# Patient Record
Sex: Female | Born: 1995
Health system: Southern US, Community
[De-identification: ages and names within clinical notes are randomized; demographics above are authoritative.]

## PROBLEM LIST (undated history)

## (undated) DIAGNOSIS — K219 Gastro-esophageal reflux disease without esophagitis: Secondary | ICD-10-CM

## (undated) DIAGNOSIS — J45909 Unspecified asthma, uncomplicated: Secondary | ICD-10-CM

---
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
## 2016-01-08 ENCOUNTER — Emergency Department (HOSPITAL_COMMUNITY)
Admission: EM | Admit: 2016-01-08 | Discharge: 2016-01-09 | Disposition: A | Discharge status: Home / Self Care | Payer: Self-pay | Attending: Emergency Medicine | Admitting: Emergency Medicine

## 2016-01-08 ENCOUNTER — Encounter (HOSPITAL_COMMUNITY): Payer: Self-pay | Admitting: *Deleted

## 2016-01-08 DIAGNOSIS — J45909 Unspecified asthma, uncomplicated: Secondary | ICD-10-CM | POA: Insufficient documentation

## 2016-01-08 DIAGNOSIS — J0100 Acute maxillary sinusitis, unspecified: Secondary | ICD-10-CM | POA: Insufficient documentation

## 2016-01-08 HISTORY — DX: Unspecified asthma, uncomplicated: J45.909

## 2016-01-08 MED ORDER — AMOXICILLIN-POT CLAVULANATE 875-125 MG PO TABS
1.0000 | ORAL_TABLET | Freq: Once | ORAL | Status: AC
  Administered 2016-01-09: 1 via ORAL
  Filled 2016-01-08: qty 1

## 2016-01-08 MED ORDER — IBUPROFEN 800 MG PO TABS: 800.0000 mg | ORAL_TABLET | Freq: Three times a day (TID) | ORAL | 0 refills | Status: AC

## 2016-01-08 MED ORDER — AMOXICILLIN-POT CLAVULANATE 875-125 MG PO TABS: 1.0000 | ORAL_TABLET | Freq: Once | ORAL | Status: DC

## 2016-01-08 MED ORDER — IBUPROFEN 800 MG PO TABS
800.0000 mg | ORAL_TABLET | Freq: Once | ORAL | Status: AC
  Administered 2016-01-09: 800 mg via ORAL
  Filled 2016-01-08: qty 1

## 2016-01-08 MED ORDER — AMOXICILLIN-POT CLAVULANATE 875-125 MG PO TABS: 1.0000 | ORAL_TABLET | Freq: Two times a day (BID) | ORAL | 0 refills | Status: DC

## 2016-01-08 NOTE — ED Triage Notes (Signed)
Pt reports sore throat, nasal congestion and body aches x 3 days.

## 2016-01-08 NOTE — ED Provider Notes (Signed)
[facility]-EMERGENCY DEPT Provider Note   CSN: [id number] Arrival date & time: 01/08/16  2220     History   Chief Complaint Chief Complaint  Patient presents with  . Nasal Congestion    HPI Monica Cowan is a 20 y.o. female with a history of exercise induced asthma and intermittent allergy symptoms including nasal congestion and rhinorrhea which usually responds to an otc generic antihistamine and decongestant medicine.  For the past 3 days she has had a increased nasal congestion along with bilateral cheek and upper dentition pain, purulent nasal congestion fevers and headache with generalized body aches.  She also endorses mild sore throat associated with postnasal drip.  She has found no alleviators for her symptoms.   The history is provided by the patient.    Past Medical History:  Diagnosis Date  . Asthma    exercise induced    There are no active problems to display for this patient.   History reviewed. No pertinent surgical history.  OB History    No data available       Home Medications    Prior to Admission medications   Medication Sig Start Date End Date Taking? Authorizing Provider  amoxicillin-clavulanate (AUGMENTIN) 875-125 MG tablet Take 1 tablet by mouth every 12 (twelve) hours. 01/08/16   Burgess AmorJulie Maicey Barrientez, PA-C  ibuprofen (ADVIL,MOTRIN) 800 MG tablet Take 1 tablet (800 mg total) by mouth 3 (three) times daily. 01/08/16   Burgess AmorJulie Cephus Tupy, PA-C    Family History History reviewed. No pertinent family history.  Social History Social History  Substance Use Topics  . Smoking status: Never Smoker  . Smokeless tobacco: Never Used  . Alcohol use No     Allergies   Patient has no known allergies.   Review of Systems Review of Systems  Constitutional: Positive for fever. Negative for chills.  HENT: Positive for congestion, rhinorrhea and sore throat. Negative for ear pain, facial swelling, sinus pressure, trouble swallowing and voice change.   Eyes: Negative  for discharge.  Respiratory: Negative for cough, shortness of breath, wheezing and stridor.   Cardiovascular: Negative for chest pain.  Gastrointestinal: Negative for abdominal pain.  Genitourinary: Negative.   Musculoskeletal: Positive for myalgias.     Physical Exam Updated Vital Signs BP 129/87 (BP Location: Left Arm)   Pulse 98   Temp 100.2 F (37.9 C) (Temporal)   Resp 16   Ht 4\' 11"  (1.499 m)   Wt 66.2 kg   LMP 12/02/2015   SpO2 99%   BMI 29.49 kg/m   Physical Exam  Constitutional: She is oriented to person, place, and time. She appears well-developed and well-nourished.  HENT:  Head: Normocephalic and atraumatic.  Right Ear: Tympanic membrane and ear canal normal.  Left Ear: Tympanic membrane and ear canal normal.  Nose: Mucosal edema and rhinorrhea present. Right sinus exhibits maxillary sinus tenderness. Left sinus exhibits maxillary sinus tenderness.  Mouth/Throat: Uvula is midline, oropharynx is clear and moist and mucous membranes are normal. No oropharyngeal exudate, posterior oropharyngeal edema, posterior oropharyngeal erythema or tonsillar abscesses.  Eyes: Conjunctivae are normal.  Neck: Full passive range of motion without pain. Neck supple.  Cardiovascular: Normal rate and normal heart sounds.   Pulmonary/Chest: Effort normal. No respiratory distress. She has no wheezes. She has no rales.  Musculoskeletal: Normal range of motion.  Lymphadenopathy:    She has no cervical adenopathy.  No head or neck adenopathy.    Neurological: She is alert and oriented to person, place, and time.  Skin: Skin is warm and dry. No rash noted.  Psychiatric: She has a normal mood and affect.     ED Treatments / Results  Labs (all labs ordered are listed, but only abnormal results are displayed) Labs Reviewed  POC URINE PREG, ED   Urine preg negative.  EKG  EKG Interpretation None       Radiology No results found.  Procedures Procedures (including critical  care time)  Medications Ordered in ED Medications  amoxicillin-clavulanate (AUGMENTIN) 875-125 MG per tablet 1 tablet (not administered)  ibuprofen (ADVIL,MOTRIN) tablet 800 mg (not administered)     Initial Impression / Assessment and Plan / ED Course  I have reviewed the triage vital signs and the nursing notes.  Pertinent labs & imaging results that were available during my care of the patient were reviewed by me and considered in my medical decision making (see chart for details).  Clinical Course     Exam c/w acute sinusitis.  augmentin prescribed, encouraged steam tx, menthol drops.  Ibuprofen for fever and body aches.  Prn f/u, referral given for establishing medical care.  Final Clinical Impressions(s) / ED Diagnoses   Final diagnoses:  Acute maxillary sinusitis, recurrence not specified    New Prescriptions New Prescriptions   AMOXICILLIN-CLAVULANATE (AUGMENTIN) 875-125 MG TABLET    Take 1 tablet by mouth every 12 (twelve) hours.   IBUPROFEN (ADVIL,MOTRIN) 800 MG TABLET    Take 1 tablet (800 mg total) by mouth 3 (three) times daily.     Burgess AmorJulie Ciarra Braddy, PA-C 01/09/16 [id number]    Marily MemosJason Mesner, MD 01/10/16 [phone]

## 2016-01-08 NOTE — Discharge Instructions (Signed)
Take the entire course of the antibiotic prescribed.  Continue taking your decongestant and try warm compresses,  steam therapy, menthol drops or strong peppermints, etc to try to improve the congestion.

## 2016-01-09 NOTE — ED Notes (Signed)
Pt states understanding of care given and follow up instructions.  Pt ambulated from ED with SO 

## 2016-02-22 ENCOUNTER — Other Ambulatory Visit (HOSPITAL_COMMUNITY): Payer: Self-pay | Admitting: Nurse Practitioner

## 2016-02-22 DIAGNOSIS — R1031 Right lower quadrant pain: Secondary | ICD-10-CM

## 2016-02-22 DIAGNOSIS — R102 Pelvic and perineal pain: Secondary | ICD-10-CM

## 2016-02-29 ENCOUNTER — Ambulatory Visit (HOSPITAL_COMMUNITY)
Admission: RE | Admit: 2016-02-29 | Discharge: 2016-02-29 | Disposition: A | Discharge status: Home / Self Care | Payer: Self-pay | Source: Ambulatory Visit | Attending: Nurse Practitioner | Admitting: Nurse Practitioner

## 2016-02-29 DIAGNOSIS — R102 Pelvic and perineal pain: Secondary | ICD-10-CM

## 2016-02-29 DIAGNOSIS — N949 Unspecified condition associated with female genital organs and menstrual cycle: Secondary | ICD-10-CM | POA: Insufficient documentation

## 2016-02-29 DIAGNOSIS — R1031 Right lower quadrant pain: Secondary | ICD-10-CM | POA: Insufficient documentation

## 2017-03-01 ENCOUNTER — Other Ambulatory Visit: Payer: Self-pay

## 2017-03-01 ENCOUNTER — Emergency Department (HOSPITAL_COMMUNITY)
Admission: EM | Admit: 2017-03-01 | Discharge: 2017-03-02 | Disposition: A | Discharge status: Home / Self Care | Payer: BLUE CROSS/BLUE SHIELD | Attending: Emergency Medicine | Admitting: Emergency Medicine

## 2017-03-01 ENCOUNTER — Encounter (HOSPITAL_COMMUNITY): Payer: Self-pay | Admitting: Emergency Medicine

## 2017-03-01 DIAGNOSIS — J45909 Unspecified asthma, uncomplicated: Secondary | ICD-10-CM | POA: Diagnosis not present

## 2017-03-01 DIAGNOSIS — R102 Pelvic and perineal pain: Secondary | ICD-10-CM | POA: Insufficient documentation

## 2017-03-01 DIAGNOSIS — Z79899 Other long term (current) drug therapy: Secondary | ICD-10-CM | POA: Insufficient documentation

## 2017-03-01 DIAGNOSIS — R1031 Right lower quadrant pain: Secondary | ICD-10-CM | POA: Diagnosis present

## 2017-03-01 LAB — COMPREHENSIVE METABOLIC PANEL
ALBUMIN: 4.5 g/dL (ref 3.5–5.0)
ALK PHOS: 80 U/L (ref 38–126)
ALT: 24 U/L (ref 14–54)
AST: 19 U/L (ref 15–41)
Anion gap: 9 (ref 5–15)
BUN: 11 mg/dL (ref 6–20)
CALCIUM: 9.5 mg/dL (ref 8.9–10.3)
CO2: 25 mmol/L (ref 22–32)
Chloride: 104 mmol/L (ref 101–111)
Creatinine, Ser: 0.64 mg/dL (ref 0.44–1.00)
GFR calc Af Amer: >60 mL/min (ref >60)
GFR calc non Af Amer: >60 mL/min (ref >60)
GLUCOSE: 107 mg/dL — AB (ref 65–99)
Potassium: 3.7 mmol/L (ref 3.5–5.1)
Sodium: 138 mmol/L (ref 135–145)
Total Bilirubin: 0.2 mg/dL — ABNORMAL LOW (ref 0.3–1.2)
Total Protein: 8.2 g/dL — ABNORMAL HIGH (ref 6.5–8.1)

## 2017-03-01 LAB — CBC
HCT: 43.7 % (ref 36.0–46.0)
Hemoglobin: 14 g/dL (ref 12.0–15.0)
MCH: 28.1 pg (ref 26.0–34.0)
MCHC: 32 g/dL (ref 30.0–36.0)
MCV: 87.8 fL (ref 78.0–100.0)
PLATELETS: 309 10*3/uL (ref 150–400)
RBC: 4.98 MIL/uL (ref 3.87–5.11)
RDW: 12.3 % (ref 11.5–15.5)
WBC: 10.2 10*3/uL (ref 4.0–10.5)

## 2017-03-01 LAB — LIPASE, BLOOD: Lipase: 27 U/L (ref 11–51)

## 2017-03-01 NOTE — ED Provider Notes (Signed)
[facility] EMERGENCY DEPARTMENT Provider Note   CSN: [id number] Arrival date & time: 03/01/17  2151     History   Chief Complaint Chief Complaint  Patient presents with  . Abdominal Pain    HPI Monica Cowan is a 22 y.o. female.  HPI  Monica Cowan is a 22 y.o. female who presents to the Emergency Department complaining of right lower abdominal pain for 2 years.  Pain has been intermittent.  Pain became worse several hours prior to arrival.  She states that she has had long-term pain of her right lower abdomen that is associated with onset of menses.  She describes the pain is typically being crampy, but today pain became sharp and associated with movement.  She has not tried any medications for symptomatic relief.  She denies fever, chills, dysuria, decreased appetite, and vaginal bleeding.   Past Medical History:  Diagnosis Date  . Asthma    exercise induced    There are no active problems to display for this patient.   History reviewed. No pertinent surgical history.  OB History    No data available       Home Medications    Prior to Admission medications   Medication Sig Start Date End Date Taking? Authorizing Provider  amoxicillin-clavulanate (AUGMENTIN) 875-125 MG tablet Take 1 tablet by mouth every 12 (twelve) hours. 01/08/16   Burgess AmorIdol, Julie, PA-C  ibuprofen (ADVIL,MOTRIN) 800 MG tablet Take 1 tablet (800 mg total) by mouth 3 (three) times daily. 01/08/16   Burgess AmorIdol, Julie, PA-C    Family History No family history on file.  Social History Social History   Tobacco Use  . Smoking status: Never Smoker  . Smokeless tobacco: Never Used  Substance Use Topics  . Alcohol use: No  . Drug use: Not on file     Allergies   Patient has no known allergies.   Review of Systems Review of Systems  Constitutional: Negative for appetite change, chills and fever.  Respiratory: Negative for chest tightness and shortness of breath.   Cardiovascular: Negative for  chest pain.  Gastrointestinal: Positive for abdominal pain. Negative for blood in stool, diarrhea, nausea and vomiting.  Genitourinary: Positive for menstrual problem. Negative for decreased urine volume, difficulty urinating, dysuria, flank pain, vaginal bleeding and vaginal discharge.  Musculoskeletal: Negative for back pain.  Skin: Negative for color change and rash.  Neurological: Negative for dizziness, weakness and numbness.  Hematological: Negative for adenopathy.  All other systems reviewed and are negative.    Physical Exam Updated Vital Signs BP (!) 133/92   Pulse 89   Temp 98.3 F (36.8 C)   Resp 18   Ht 4\' 11"  (1.499 m)   Wt 77.1 kg (170 lb)   LMP 02/06/2017   SpO2 100%   BMI 34.34 kg/m   Physical Exam  Constitutional: She is oriented to person, place, and time. She appears well-developed and well-nourished. No distress.  HENT:  Head: Normocephalic.  Mouth/Throat: Oropharynx is clear and moist.  Cardiovascular: Normal rate, regular rhythm, normal heart sounds and intact distal pulses.  No murmur heard. Pulmonary/Chest: Effort normal and breath sounds normal. No respiratory distress.  Abdominal: Soft. Normal appearance and bowel sounds are normal. She exhibits no distension and no mass. There is tenderness in the suprapubic area. There is no rigidity, no rebound, no guarding, no CVA tenderness and no tenderness at McBurney's point.  Genitourinary: Vagina normal and uterus normal. There is no tenderness on the right labia. There is no  tenderness on the left labia. Cervix exhibits no motion tenderness. Right adnexum displays no mass and no tenderness. Left adnexum displays no mass and no tenderness. No bleeding in the vagina. No vaginal discharge found.  Genitourinary Comments: Exam assisted by nursing staff.  No cervical motion tenderness no adnexal masses or tenderness on exam.  No significant vaginal discharge or bleeding.  Musculoskeletal: Normal range of motion. She  exhibits no edema.  Neurological: She is alert and oriented to person, place, and time. She exhibits normal muscle tone. Coordination normal.  Skin: Skin is warm and dry.  Nursing note and vitals reviewed.    ED Treatments / Results  Labs (all labs ordered are listed, but only abnormal results are displayed) Labs Reviewed  WET PREP, GENITAL - Abnormal; Notable for the following components:      Result Value   Clue Cells Wet Prep HPF POC PRESENT (*)    WBC, Wet Prep HPF POC FEW (*)    All other components within normal limits  COMPREHENSIVE METABOLIC PANEL - Abnormal; Notable for the following components:   Glucose, Bld 107 (*)    Total Protein 8.2 (*)    Total Bilirubin 0.2 (*)    All other components within normal limits  LIPASE, BLOOD  CBC  URINALYSIS, ROUTINE W REFLEX MICROSCOPIC  PREGNANCY, URINE  POC URINE PREG, ED  GC/CHLAMYDIA PROBE AMP ([place]) NOT AT [facility]    EKG  EKG Interpretation None       Radiology No results found.  Procedures Procedures (including critical care time)  Medications Ordered in ED Medications - No data to display   Initial Impression / Assessment and Plan / ED Course  I have reviewed the triage vital signs and the nursing notes.  Pertinent labs & imaging results that were available during my care of the patient were reviewed by me and considered in my medical decision making (see chart for details).     Patient well-appearing.  Symptoms chronic likely acute flare.  Symptoms associated with onset of menses.  Pain is right pelvic.  Pelvic exam reassuring, doubt  TOA or abscess.  Discussed possible ovarian cyst.  Patient agrees to return for outpatient pelvic ultrasound.  Will treat for possible PID given IM Rocephin and prescription for Doxy, cultures are pending.  She appears safe for discharge home return precautions were discussed.  Final Clinical Impressions(s) / ED Diagnoses   Final diagnoses:  Pelvic pain in female     ED Discharge Orders    None       Rosey Bath 03/02/17 2044    Devoria Albe, MD 03/02/17 [phone]

## 2017-03-01 NOTE — ED Triage Notes (Signed)
Pt c/o right lower abd pain that got worse today. Pt states the pain has been intermittent x 2 year and always comes when she gets ready to start her period.

## 2017-03-02 ENCOUNTER — Other Ambulatory Visit (HOSPITAL_COMMUNITY): Payer: Self-pay | Admitting: Emergency Medicine

## 2017-03-02 DIAGNOSIS — R102 Pelvic and perineal pain: Secondary | ICD-10-CM

## 2017-03-02 LAB — URINALYSIS, ROUTINE W REFLEX MICROSCOPIC
BILIRUBIN URINE: NEGATIVE
Glucose, UA: NEGATIVE mg/dL
HGB URINE DIPSTICK: NEGATIVE
Ketones, ur: NEGATIVE mg/dL
Leukocytes, UA: NEGATIVE
Nitrite: NEGATIVE
Protein, ur: NEGATIVE mg/dL
Specific Gravity, Urine: 1.015 (ref 1.005–1.030)
pH: 6 (ref 5.0–8.0)

## 2017-03-02 LAB — WET PREP, GENITAL
Sperm: NONE SEEN
Trich, Wet Prep: NONE SEEN
Yeast Wet Prep HPF POC: NONE SEEN

## 2017-03-02 LAB — POC URINE PREG, ED: PREG TEST UR: NEGATIVE

## 2017-03-02 LAB — PREGNANCY, URINE: PREG TEST UR: NEGATIVE

## 2017-03-02 MED ORDER — LIDOCAINE HCL (PF) 1 % IJ SOLN
INTRAMUSCULAR | Status: AC
  Administered 2017-03-02: 0.9 mL
  Filled 2017-03-02: qty 2

## 2017-03-02 MED ORDER — CEFTRIAXONE SODIUM 250 MG IJ SOLR
INTRAMUSCULAR | Status: AC
  Administered 2017-03-02: 250 mg via INTRAMUSCULAR
  Filled 2017-03-02: qty 250

## 2017-03-02 MED ORDER — CEFTRIAXONE SODIUM 250 MG IJ SOLR
250.0000 mg | Freq: Once | INTRAMUSCULAR | Status: AC
  Administered 2017-03-02: 250 mg via INTRAMUSCULAR

## 2017-03-02 NOTE — ED Notes (Signed)
Pelvic exam performed and specimens sent to lab

## 2017-03-02 NOTE — ED Notes (Signed)
Pelvic set up; urine specimen obtained and sent to lab

## 2017-03-05 ENCOUNTER — Ambulatory Visit (HOSPITAL_COMMUNITY)
Admission: RE | Admit: 2017-03-05 | Discharge: 2017-03-05 | Disposition: A | Discharge status: Home / Self Care | Payer: BLUE CROSS/BLUE SHIELD | Source: Ambulatory Visit | Attending: Emergency Medicine | Admitting: Emergency Medicine

## 2017-03-05 DIAGNOSIS — R102 Pelvic and perineal pain: Secondary | ICD-10-CM | POA: Insufficient documentation

## 2017-03-05 LAB — GC/CHLAMYDIA PROBE AMP (~~LOC~~) NOT AT ARMC
Chlamydia: NEGATIVE
Neisseria Gonorrhea: NEGATIVE

## 2017-10-28 ENCOUNTER — Inpatient Hospital Stay (HOSPITAL_COMMUNITY)
Admission: AD | Admit: 2017-10-28 | Discharge: 2017-10-29 | Disposition: A | Discharge status: Home / Self Care | Payer: BLUE CROSS/BLUE SHIELD | Source: Ambulatory Visit | Attending: Family Medicine | Admitting: Family Medicine

## 2017-10-28 DIAGNOSIS — Z791 Long term (current) use of non-steroidal anti-inflammatories (NSAID): Secondary | ICD-10-CM | POA: Insufficient documentation

## 2017-10-28 DIAGNOSIS — R102 Pelvic and perineal pain: Secondary | ICD-10-CM | POA: Insufficient documentation

## 2017-10-28 DIAGNOSIS — G8929 Other chronic pain: Secondary | ICD-10-CM | POA: Insufficient documentation

## 2017-10-28 DIAGNOSIS — K219 Gastro-esophageal reflux disease without esophagitis: Secondary | ICD-10-CM | POA: Insufficient documentation

## 2017-10-28 DIAGNOSIS — J45909 Unspecified asthma, uncomplicated: Secondary | ICD-10-CM | POA: Insufficient documentation

## 2017-10-28 DIAGNOSIS — R350 Frequency of micturition: Secondary | ICD-10-CM | POA: Insufficient documentation

## 2017-10-28 DIAGNOSIS — Z79899 Other long term (current) drug therapy: Secondary | ICD-10-CM | POA: Insufficient documentation

## 2017-10-28 HISTORY — DX: Gastro-esophageal reflux disease without esophagitis: K21.9

## 2017-10-29 ENCOUNTER — Encounter (HOSPITAL_COMMUNITY): Payer: Self-pay

## 2017-10-29 DIAGNOSIS — Z79899 Other long term (current) drug therapy: Secondary | ICD-10-CM | POA: Diagnosis not present

## 2017-10-29 DIAGNOSIS — R102 Pelvic and perineal pain: Secondary | ICD-10-CM | POA: Diagnosis present

## 2017-10-29 DIAGNOSIS — G8929 Other chronic pain: Secondary | ICD-10-CM

## 2017-10-29 DIAGNOSIS — R350 Frequency of micturition: Secondary | ICD-10-CM | POA: Diagnosis not present

## 2017-10-29 DIAGNOSIS — K219 Gastro-esophageal reflux disease without esophagitis: Secondary | ICD-10-CM | POA: Diagnosis not present

## 2017-10-29 DIAGNOSIS — Z791 Long term (current) use of non-steroidal anti-inflammatories (NSAID): Secondary | ICD-10-CM | POA: Diagnosis not present

## 2017-10-29 DIAGNOSIS — J45909 Unspecified asthma, uncomplicated: Secondary | ICD-10-CM | POA: Diagnosis not present

## 2017-10-29 LAB — URINALYSIS, ROUTINE W REFLEX MICROSCOPIC
Bilirubin Urine: NEGATIVE
Glucose, UA: NEGATIVE mg/dL
Hgb urine dipstick: NEGATIVE
Ketones, ur: NEGATIVE mg/dL
LEUKOCYTES UA: NEGATIVE
Nitrite: NEGATIVE
PH: 5 (ref 5.0–8.0)
Protein, ur: NEGATIVE mg/dL
SPECIFIC GRAVITY, URINE: 1.023 (ref 1.005–1.030)

## 2017-10-29 LAB — CBC
HEMATOCRIT: 38.7 % (ref 36.0–46.0)
HEMOGLOBIN: 12.6 g/dL (ref 12.0–15.0)
MCH: 28.9 pg (ref 26.0–34.0)
MCHC: 32.6 g/dL (ref 30.0–36.0)
MCV: 88.8 fL (ref 80.0–100.0)
Platelets: 274 10*3/uL (ref 150–400)
RBC: 4.36 MIL/uL (ref 3.87–5.11)
RDW: 12.8 % (ref 11.5–15.5)
WBC: 10.3 10*3/uL (ref 4.0–10.5)

## 2017-10-29 LAB — WET PREP, GENITAL
Clue Cells Wet Prep HPF POC: NONE SEEN
SPERM: NONE SEEN
TRICH WET PREP: NONE SEEN
Yeast Wet Prep HPF POC: NONE SEEN

## 2017-10-29 LAB — POCT PREGNANCY, URINE: Preg Test, Ur: NEGATIVE

## 2017-10-29 MED ORDER — NAPROXEN 500 MG PO TABS
500.0000 mg | ORAL_TABLET | Freq: Two times a day (BID) | ORAL | Status: DC
  Administered 2017-10-29: 500 mg via ORAL
  Filled 2017-10-29 (×2): qty 1

## 2017-10-29 NOTE — MAU Provider Note (Signed)
History     CSN: [id number]  Arrival date and time: 10/28/17 2347   First Provider Initiated Contact with Patient 10/29/17 0041      Chief Complaint  Patient presents with  . Pelvic Pain   22 y.o. G0 non-pregnant female here with pelvic pain. Reports 3 year hx of pelvic pain. Was originally on right, now is bilateral. Pain was worse earlier today. Describes as sharp and intermittent. Rates 2/10. Pain occurs anytime of the month, no longer correlates with menses. Uses Naproxen and has some relief. Didn't take any today. Had some nausea earlier. No vomiting. No C/D. No fevers. No vaginal discharge. No new sexual partner. No hx STDs. She has Nexplanon in place since last year, and does not have regular periods. Has urinary urgency and frequency since she was a child d/t "having the bladder like a pregnant woman", hx if bed wetting until menarche. No dysuria or hematuria. She was seen at [facility] earlier this year and had pelvic US. 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  Past Medical History:  Diagnosis Date  . Asthma    exercise induced  . GERD (gastroesophageal reflux disease)     History reviewed. No pertinent surgical history.  No family history on file.  Social History   Tobacco Use  . Smoking status: Never Smoker  . Smokeless tobacco: Never Used  Substance Use Topics  . Alcohol use: Yes  . Drug use: Not Currently    Allergies: No Known Allergies  Medications Prior to Admission  Medication Sig Dispense Refill Last Dose  . ALPRAZolam (XANAX) 1 MG tablet Take 1 mg by mouth at bedtime as needed for anxiety.   Past Month at Unknown time  . amoxicillin-clavulanate (AUGMENTIN) 875-125 MG tablet Take 1 tablet by mouth every 12 (twelve) hours. 14 tablet 0   . ibuprofen (ADVIL,MOTRIN) 800 MG tablet Take 1 tablet (800 mg total) by mouth 3 (three) times daily. 21 tablet 0     Review of Systems  Constitutional: Negative for fever.  Gastrointestinal: Positive for nausea.  Negative for abdominal pain, constipation, diarrhea and vomiting.  Genitourinary: Positive for frequency, pelvic pain and urgency. Negative for dysuria, hematuria, vaginal bleeding and vaginal discharge.   Physical Exam   Blood pressure 122/79, pulse 84, resp. rate 16, height 4\' 11"  (1.499 m), weight 84.4 kg, last menstrual period 08/23/2017, SpO2 97 %.  Physical Exam  Constitutional: She is oriented to person, place, and time. She appears well-developed and well-nourished.  HENT:  Head: Normocephalic and atraumatic.  Neck: Normal range of motion.  Cardiovascular: Normal rate.  Respiratory: Effort normal. No respiratory distress.  GI: Soft. She exhibits no distension and no mass. There is no tenderness. There is no rebound and no guarding.  Genitourinary:  Genitourinary Comments: External: no lesions or erythema Vagina: rugated, pink, moist, thin white discharge Uterus: non enlarged, anteverted, non tender, no CMT Adnexae: no masses, no tenderness left, + tenderness right Cervix normal   Musculoskeletal: Normal range of motion.  Neurological: She is alert and oriented to person, place, and time.  Skin: Skin is warm and dry.  Psychiatric: She has a normal mood and affect.   Results for orders placed or performed during the hospital encounter of 10/28/17 (from the past 24 hour(s))  Urinalysis, Routine w reflex microscopic     Status: Abnormal   Collection Time: 10/29/17 12:35 AM  Result Value Ref Range   Color, Urine YELLOW YELLOW   APPearance HAZY (A) CLEAR  Specific Gravity, Urine 1.023 1.005 - 1.030   pH 5.0 5.0 - 8.0   Glucose, UA NEGATIVE NEGATIVE mg/dL   Hgb urine dipstick NEGATIVE NEGATIVE   Bilirubin Urine NEGATIVE NEGATIVE   Ketones, ur NEGATIVE NEGATIVE mg/dL   Protein, ur NEGATIVE NEGATIVE mg/dL   Nitrite NEGATIVE NEGATIVE   Leukocytes, UA NEGATIVE NEGATIVE  Pregnancy, urine POC     Status: None   Collection Time: 10/29/17 12:42 AM  Result Value Ref Range   Preg  Test, Ur NEGATIVE NEGATIVE  Wet prep, genital     Status: Abnormal   Collection Time: 10/29/17 12:52 AM  Result Value Ref Range   Yeast Wet Prep HPF POC NONE SEEN NONE SEEN   Trich, Wet Prep NONE SEEN NONE SEEN   Clue Cells Wet Prep HPF POC NONE SEEN NONE SEEN   WBC, Wet Prep HPF POC FEW (A) NONE SEEN   Sperm NONE SEEN   CBC     Status: None   Collection Time: 10/29/17  1:00 AM  Result Value Ref Range   WBC 10.3 4.0 - 10.5 K/uL   RBC 4.36 3.87 - 5.11 MIL/uL   Hemoglobin 12.6 12.0 - 15.0 g/dL   HCT 82.9 56.2 - 13.0 %   MCV 88.8 80.0 - 100.0 fL   MCH 28.9 26.0 - 34.0 pg   MCHC 32.6 30.0 - 36.0 g/dL   RDW 86.5 78.4 - 69.6 %   Platelets 274 150 - 400 K/uL   MAU Course  Procedures Toradol>declined>Naproxen  MDM Labs ordered and reviewed. Review of chart shows normal pelvic US and negative STD screen in Feb 2019. Pain chronic in nature, no acute process identified today. Will order outpt pelvic US and strongly suggest f/u with GYN. Stable for discharge home.  Assessment and Plan   1. Chronic pelvic pain in female    Discharge home Follow up at [facility] Pelvic US in 1 week Naproxen OTC prn  Allergies as of 10/29/2017   No Known Allergies     Medication List    STOP taking these medications   amoxicillin-clavulanate 875-125 MG tablet Commonly known as:  AUGMENTIN     TAKE these medications   ALPRAZolam 1 MG tablet Commonly known as:  XANAX Take 1 mg by mouth at bedtime as needed for anxiety.   ibuprofen 800 MG tablet Commonly known as:  ADVIL,MOTRIN Take 1 tablet (800 mg total) by mouth 3 (three) times daily.      Donette Larry, CNM 10/29/2017, 1:53 AM

## 2017-10-29 NOTE — MAU Note (Signed)
Sharp lower right pain - feels like ovaries. Now pain on left side.  Has had right sided pain since 2016.

## 2017-10-29 NOTE — Discharge Instructions (Signed)
Pelvic Pain, Female °Pelvic pain is pain in your lower belly (abdomen), below your belly button and between your hips. The pain may start suddenly (acute), keep coming back (recurring), or last a long time (chronic). Pelvic pain that lasts longer than six months is considered chronic. There are many causes of pelvic pain. Sometimes the cause of your pelvic pain is not known. °Follow these instructions at home: °· Take over-the-counter and prescription medicines only as told by your doctor. °· Rest as told by your doctor. °· Do not have sex it if hurts. °· Keep a journal of your pelvic pain. Write down: °? When the pain started. °? Where the pain is located. °? What seems to make the pain better or worse, such as food or your menstrual cycle. °? Any symptoms you have along with the pain. °· Keep all follow-up visits as told by your doctor. This is important. °Contact a doctor if: °· Medicine does not help your pain. °· Your pain comes back. °· You have new symptoms. °· You have unusual vaginal discharge or bleeding. °· You have a fever or chills. °· You are having a hard time pooping (constipation). °· You have blood in your pee (urine) or poop (stool). °· Your pee smells bad. °· You feel weak or lightheaded. °Get help right away if: °· You have sudden pain that is very bad. °· Your pain continues to get worse. °· You have very bad pain and also have any of the following symptoms: °? A fever. °? Feeling stick to your stomach (nausea). °? Throwing up (vomiting). °? Being very sweaty. °· You pass out (lose consciousness). °This information is not intended to replace advice given to you by your health care provider. Make sure you discuss any questions you have with your health care provider. °Document Released: 06/14/2007 Document Revised: 01/20/2015 Document Reviewed: 10/16/2014 °Elsevier Interactive Patient Education © 2018 [employer] ° °

## 2017-10-30 LAB — GC/CHLAMYDIA PROBE AMP (~~LOC~~) NOT AT ARMC
Chlamydia: NEGATIVE
Neisseria Gonorrhea: NEGATIVE

## 2018-02-16 IMAGING — US US PELVIS COMPLETE
1 series · 14 of 25 positions shown · non-contrast
Comparison: No recent prior.

CLINICAL DATA: Pelvic pain .

EXAM:
TRANSABDOMINAL AND TRANSVAGINAL ULTRASOUND OF PELVIS
TECHNIQUE: Both transabdominal and transvaginal ultrasound examinations of the
pelvis were performed. Transabdominal technique was performed for
global imaging of the pelvis including uterus, ovaries, adnexal
regions, and pelvic cul-de-sac. It was necessary to proceed with
endovaginal exam following the transabdominal exam to visualize the
uterus and ovaries.

[Series 1: us pelvis complete · 0.24mm/px · 14 of 80 slices shown]
[im 1/80]
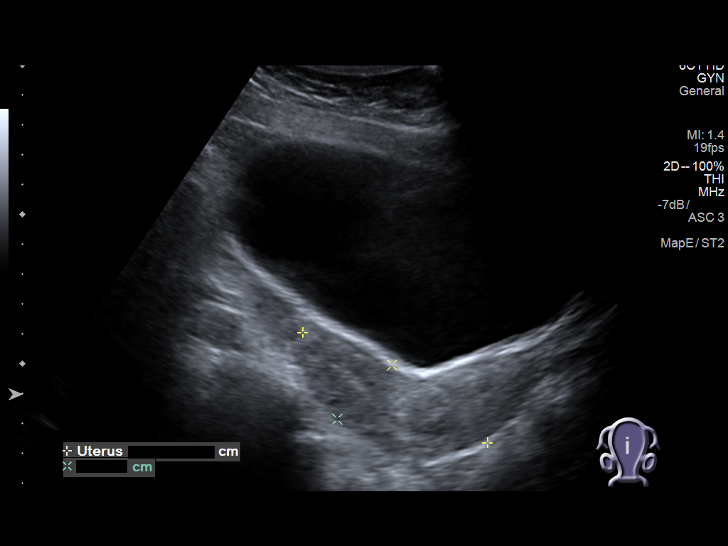
[im 7/80]
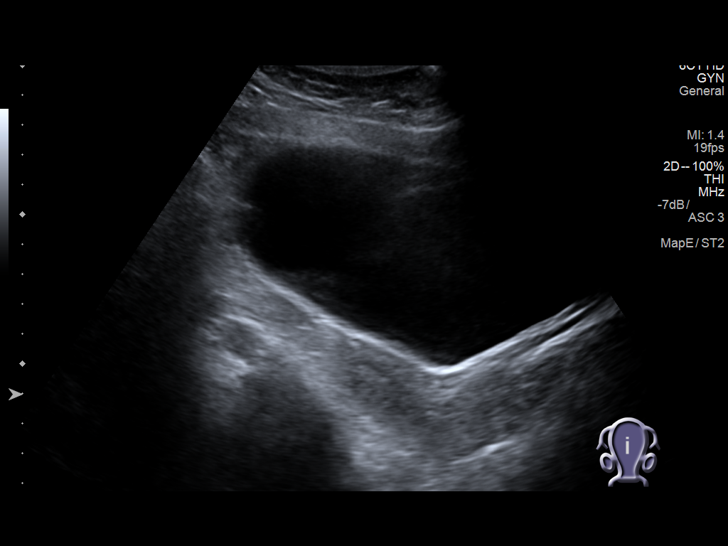
[im 14/80]
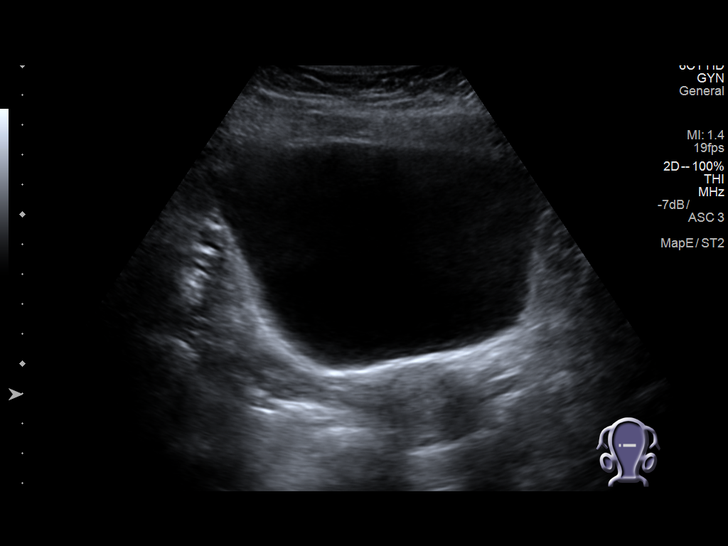
[im 20/80]
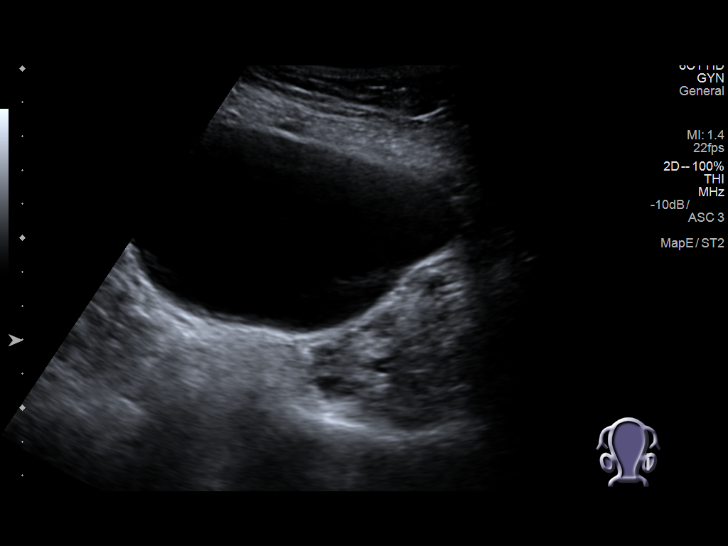
[im 27/80]
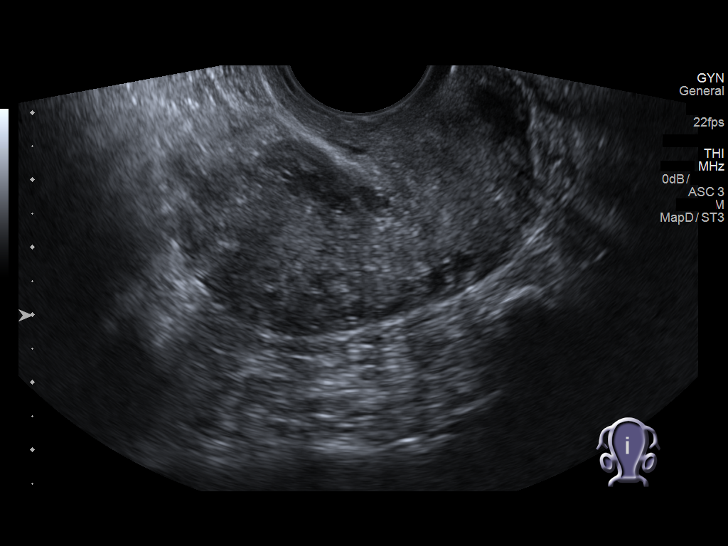
[im 30/80]
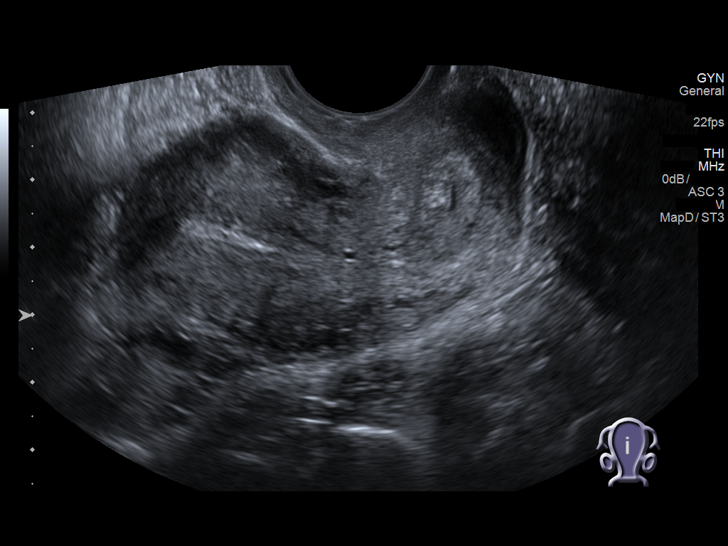
[im 37/80]
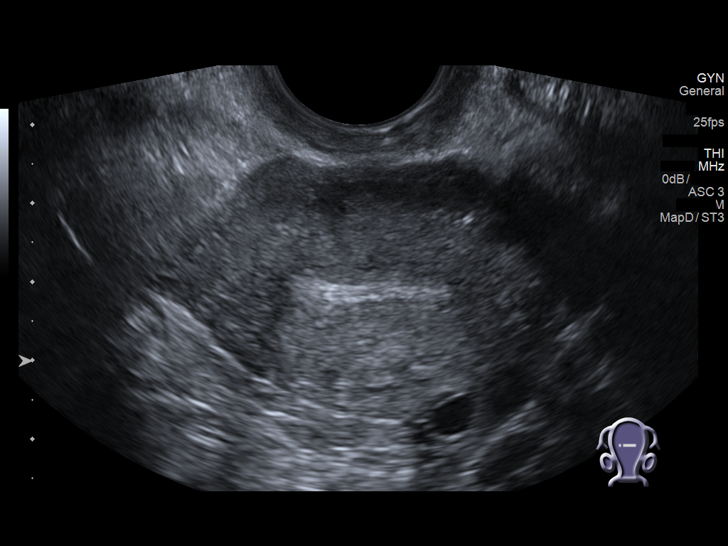
[im 43/80]
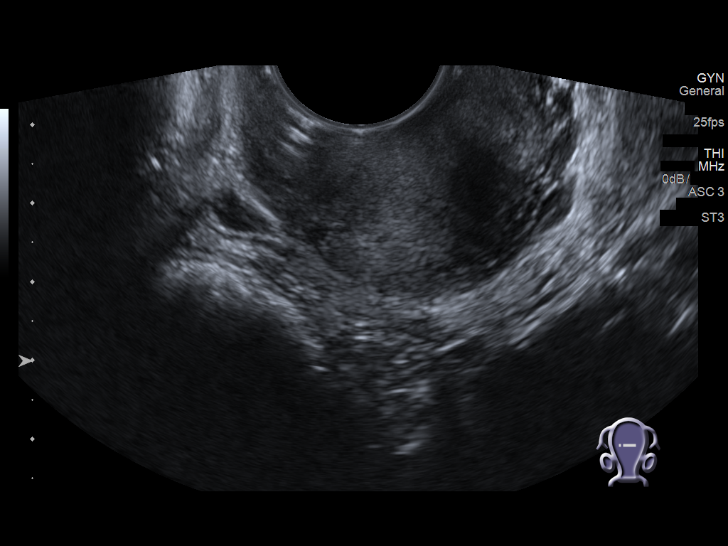
[im 50/80]
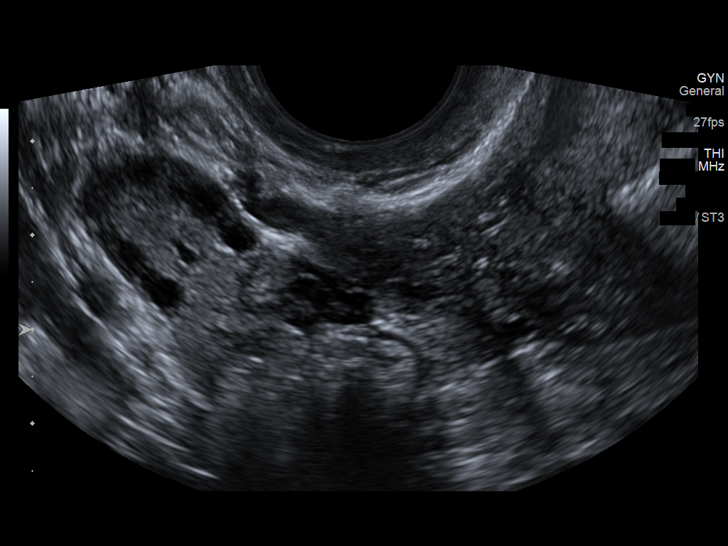
[im 53/80]
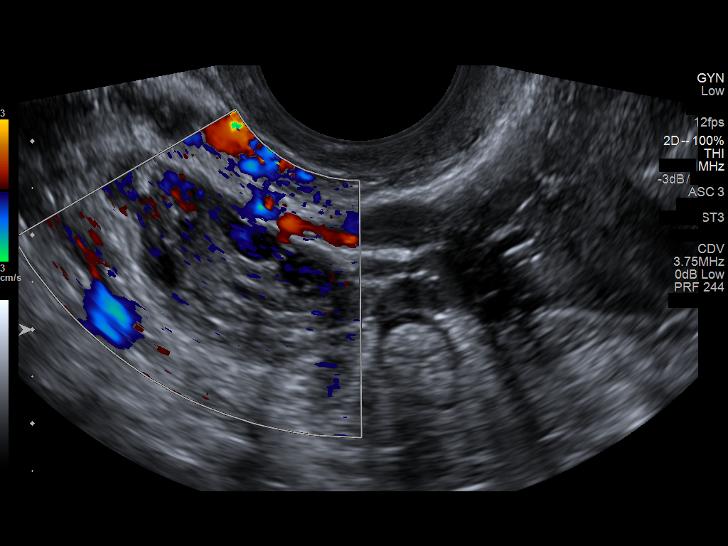
[im 60/80]
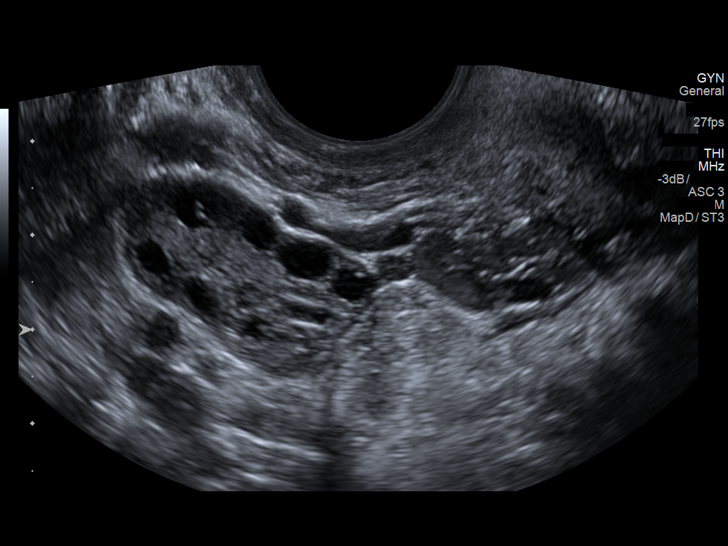
[im 66/80]
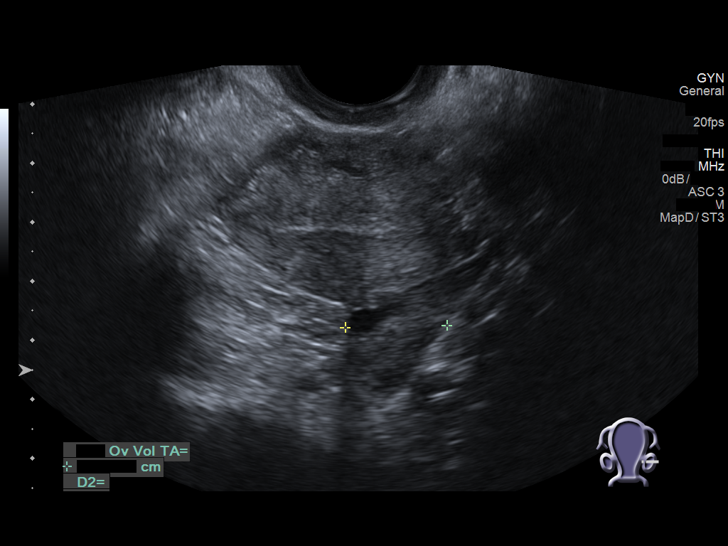
[im 73/80]
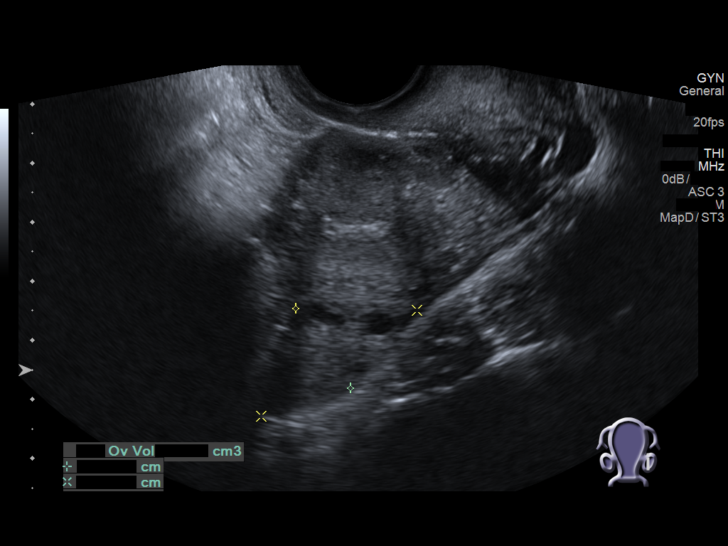
[im 80/80]
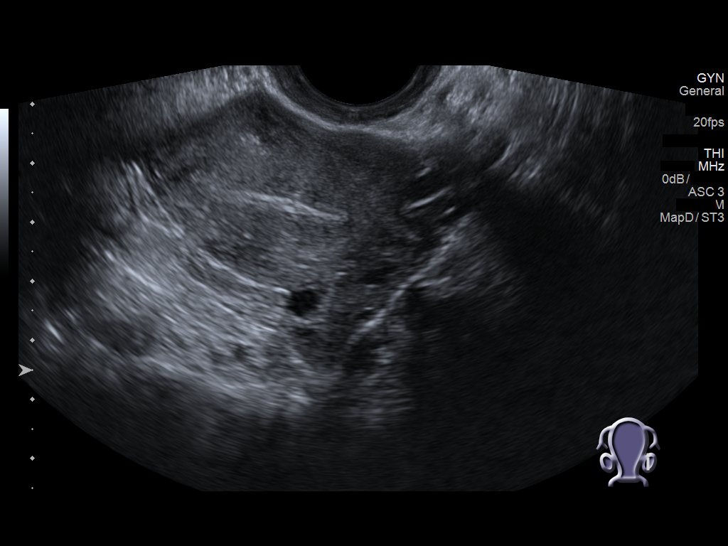

[14 of 25 positions shown; findings below may reference images not displayed]

FINDINGS: Uterus

Measurements: 6.2 x 3.4 x 4.3 cm. No fibroids or other mass
visualized.

Endometrium

Thickness: 5.2 mm.  No focal abnormality visualized.

Right ovary

Measurements: 2.8 x 1.3 x 2.4 cm. Normal appearance/no adnexal mass.
Prominence of the adjacent vasculature, this is nonspecific.

Left ovary

Measurements: 3.2 x 1.6 x 1.7 cm. Normal appearance/no adnexal mass.

Other findings

No abnormal free fluid.
IMPRESSION: No acute acute or focal abnormality.

## 2019-02-03 ENCOUNTER — Ambulatory Visit: Payer: Self-pay | Attending: Internal Medicine

## 2019-02-03 ENCOUNTER — Other Ambulatory Visit: Payer: Self-pay

## 2019-02-03 DIAGNOSIS — Z20822 Contact with and (suspected) exposure to covid-19: Secondary | ICD-10-CM | POA: Insufficient documentation

## 2019-02-04 LAB — NOVEL CORONAVIRUS, NAA: SARS-CoV-2, NAA: NOT DETECTED

## 2019-03-19 DIAGNOSIS — Z6837 Body mass index (BMI) 37.0-37.9, adult: Secondary | ICD-10-CM | POA: Diagnosis not present

## 2019-03-19 DIAGNOSIS — Z20828 Contact with and (suspected) exposure to other viral communicable diseases: Secondary | ICD-10-CM | POA: Diagnosis not present

## 2019-03-19 DIAGNOSIS — J019 Acute sinusitis, unspecified: Secondary | ICD-10-CM | POA: Diagnosis not present

## 2019-03-31 DIAGNOSIS — Z0000 Encounter for general adult medical examination without abnormal findings: Secondary | ICD-10-CM | POA: Diagnosis not present

## 2019-03-31 DIAGNOSIS — R5383 Other fatigue: Secondary | ICD-10-CM | POA: Diagnosis not present

## 2019-04-02 DIAGNOSIS — Z0000 Encounter for general adult medical examination without abnormal findings: Secondary | ICD-10-CM | POA: Diagnosis not present

## 2019-04-16 DIAGNOSIS — L718 Other rosacea: Secondary | ICD-10-CM | POA: Diagnosis not present

## 2019-04-29 ENCOUNTER — Encounter: Payer: Self-pay | Admitting: Women's Health

## 2019-04-29 DIAGNOSIS — F419 Anxiety disorder, unspecified: Secondary | ICD-10-CM | POA: Diagnosis not present

## 2019-04-29 DIAGNOSIS — Z1331 Encounter for screening for depression: Secondary | ICD-10-CM | POA: Diagnosis not present

## 2019-04-29 DIAGNOSIS — L709 Acne, unspecified: Secondary | ICD-10-CM | POA: Diagnosis not present

## 2019-04-29 DIAGNOSIS — Z6838 Body mass index (BMI) 38.0-38.9, adult: Secondary | ICD-10-CM | POA: Diagnosis not present

## 2019-04-29 DIAGNOSIS — F988 Other specified behavioral and emotional disorders with onset usually occurring in childhood and adolescence: Secondary | ICD-10-CM | POA: Diagnosis not present

## 2019-04-29 DIAGNOSIS — Z1389 Encounter for screening for other disorder: Secondary | ICD-10-CM | POA: Diagnosis not present

## 2019-05-13 ENCOUNTER — Ambulatory Visit (INDEPENDENT_AMBULATORY_CARE_PROVIDER_SITE_OTHER): Payer: Self-pay | Admitting: Women's Health

## 2019-05-13 ENCOUNTER — Other Ambulatory Visit: Payer: Self-pay

## 2019-05-13 ENCOUNTER — Encounter: Payer: Self-pay | Admitting: Women's Health

## 2019-05-13 VITALS — BP 120/90 | HR 83 | Ht 59.0 in | Wt 183.0 lb

## 2019-05-13 DIAGNOSIS — Z3046 Encounter for surveillance of implantable subdermal contraceptive: Secondary | ICD-10-CM

## 2019-05-13 DIAGNOSIS — Z30011 Encounter for initial prescription of contraceptive pills: Secondary | ICD-10-CM

## 2019-05-13 MED ORDER — LO LOESTRIN FE 1 MG-10 MCG / 10 MCG PO TABS: 1.0000 | ORAL_TABLET | Freq: Every day | ORAL | 3 refills | Status: AC

## 2019-05-13 NOTE — Progress Notes (Signed)
   NEXPLANON REMOVAL Patient name: Monica Cowan MRN [id number]  Date of birth: [birth year] Subjective Findings:   Monica Cowan is a 24 y.o.  Caucasian female being seen today for removal of a Nexplanon. Her Nexplanon was placed 03/02/16.  She desires removal because it is past due to come out. Signed copy of informed consent in chart. Has been told she probably has endometriosis. Has bad acne.    Patient's last menstrual period was 05/11/2019. Last pap never. Results were:  n/a The planned method of family planning is OCP (estrogen/progesterone). Does not smoke, no h/o HTN, DVT/PE, CVA, MI, or migraines w/ aura.  Depression screen [facility] 2/9 05/13/2019  Decreased Interest 0  Down, Depressed, Hopeless 0  PHQ - 2 Score 0  Altered sleeping 3  Tired, decreased energy 0  Change in appetite 3  Feeling bad or failure about yourself  1  Trouble concentrating 0  Moving slowly or fidgety/restless 0  Suicidal thoughts 0  PHQ-9 Score 7  Difficult doing work/chores Not difficult at all    Pertinent History Reviewed:   Reviewed past medical,surgical, social, obstetrical and family history.  Reviewed problem list, medications and allergies. Objective Findings & Procedure:    Vitals:   05/13/19 1119  BP: 120/90  Pulse: 83  Weight: 183 lb (83 kg)  Height: 4\' 11"  (1.499 m)  Body mass index is 36.96 kg/m.  No results found for this or any previous visit (from the past 24 hour(s)).   Time out was performed.  Nexplanon site identified.  Area prepped in usual sterile fashon. One cc of 2% lidocaine was used to anesthetize the area at the distal end of the implant. A small stab incision was made right beside the implant on the distal portion.  The Nexplanon rod was grasped using hemostats and removed without difficulty.  There was less than 3 cc blood loss. There were no complications.  Steri-strips were applied over the small incision and a pressure bandage was applied.  The patient tolerated the  procedure well. Assessment & Plan:   1) Nexplanon removal She was instructed to keep the area clean and dry, remove pressure bandage in 24 hours, and keep insertion site covered with the steri-strip for 3-5 days.   Follow-up PRN problems.  2) Contraception management> rx LoLo, f/u  No orders of the defined types were placed in this encounter.   Follow-up: Return in about 3 months (around 08/13/2019) for Pap & physical.  10/13/2019 CNM, [facility] 05/13/2019 11:45 AM

## 2019-05-13 NOTE — Patient Instructions (Signed)
Keep the area clean and dry.  You can remove the big bandage in 24 hours, and the small steri-strip bandage in 3-5 days.  A back up method, such as condoms, should be used for two weeks.    Oral Contraception Use Oral contraceptive pills (OCPs) are medicines that you take to prevent pregnancy. OCPs work by:  Preventing the ovaries from releasing eggs.  Thickening mucus in the lower part of the uterus (cervix), which prevents sperm from entering the uterus.  Thinning the lining of the uterus (endometrium), which prevents a fertilized egg from attaching to the endometrium. OCPs are highly effective when taken exactly as prescribed. However, OCPs do not prevent sexually transmitted infections (STIs). Safe sex practices, such as using condoms while on an OCP, can help prevent STIs. Before taking OCPs, you may have a physical exam, blood test, and Pap test. A Pap test involves taking a sample of cells from your cervix to check for cancer. Discuss with your health care provider the possible side effects of the OCP you may be prescribed. When you start an OCP, be aware that it can take 2-3 months for your body to adjust to changes in hormone levels. How to take oral contraceptive pills Follow instructions from your health care provider about how to start taking your first cycle of OCPs. Your health care provider may recommend that you:  Start the pill on day 1 of your menstrual period. If you start at this time, you will not need any backup form of birth control (contraception), such as condoms.  Start the pill on the first Sunday after your menstrual period or on the day you get your prescription. In these cases, you will need to use backup contraception for the first week.  Start the pill at any time of your cycle. ? If you take the pill within 5 days of the start of your period, you will not need a backup form of contraception. ? If you start at any other time of your menstrual cycle, you will need  to use another form of contraception for 7 days. If your OCP is the type called a minipill, it will protect you from pregnancy after taking it for 2 days (48 hours), and you can stop using backup contraception after that time. After you have started taking OCPs:  If you forget to take 1 pill, take it as soon as you remember. Take the next pill at the regular time.  If you miss 2 or more pills, call your health care provider. Different pills have different instructions for missed doses. Use backup birth control until your next menstrual period starts.  If you use a 28-day pack that contains inactive pills and you miss 1 of the last 7 pills (pills with no hormones), throw away the rest of the non-hormone pills and start a new pill pack. No matter which day you start the OCP, you will always start a new pack on that same day of the week. Have an extra pack of OCPs and a backup contraceptive method available in case you miss some pills or lose your OCP pack. Follow these instructions at home:  Do not use any products that contain nicotine or tobacco, such as cigarettes and e-cigarettes. If you need help quitting, ask your health care provider.  Always use a condom to protect against STIs. OCPs do not protect against STIs.  Use a calendar to mark the days of your menstrual period.  Read the information and directions  that came with your OCP. Talk to your health care provider if you have questions. Contact a health care provider if:  You develop nausea and vomiting.  You have abnormal vaginal discharge or bleeding.  You develop a rash.  You miss your menstrual period. Depending on the type of OCP you are taking, this may be a sign of pregnancy. Ask your health care provider for more information.  You are losing your hair.  You need treatment for mood swings or depression.  You get dizzy when taking the OCP.  You develop acne after taking the OCP.  You become pregnant or think you may be  pregnant.  You have diarrhea, constipation, and abdominal pain or cramps.  You miss 2 or more pills. Get help right away if:  You develop chest pain.  You develop shortness of breath.  You have an uncontrolled or severe headache.  You develop numbness or slurred speech.  You develop visual or speech problems.  You develop pain, redness, and swelling in your legs.  You develop weakness or numbness in your arms or legs. Summary  Oral contraceptive pills (OCPs) are medicines that you take to prevent pregnancy.  OCPs do not prevent sexually transmitted infections (STIs). Always use a condom to protect against STIs.  When you start an OCP, be aware that it can take 2-3 months for your body to adjust to changes in hormone levels.  Read all the information and directions that come with your OCP. This information is not intended to replace advice given to you by your health care provider. Make sure you discuss any questions you have with your health care provider. Document Revised: 04/19/2018 Document Reviewed: 02/07/2016 [employer] Patient Education  2020 [employer].

## 2019-06-02 DIAGNOSIS — L709 Acne, unspecified: Secondary | ICD-10-CM | POA: Diagnosis not present

## 2019-06-02 DIAGNOSIS — F988 Other specified behavioral and emotional disorders with onset usually occurring in childhood and adolescence: Secondary | ICD-10-CM | POA: Diagnosis not present

## 2019-06-02 DIAGNOSIS — F419 Anxiety disorder, unspecified: Secondary | ICD-10-CM | POA: Diagnosis not present

## 2019-06-02 DIAGNOSIS — Z6836 Body mass index (BMI) 36.0-36.9, adult: Secondary | ICD-10-CM | POA: Diagnosis not present

## 2019-07-01 ENCOUNTER — Ambulatory Visit: Payer: Self-pay | Admitting: Physician Assistant

## 2019-08-13 ENCOUNTER — Other Ambulatory Visit: Payer: Self-pay | Admitting: Women's Health

## 2019-10-22 ENCOUNTER — Ambulatory Visit: Payer: Self-pay | Admitting: Women's Health

## 2019-11-03 ENCOUNTER — Ambulatory Visit: Payer: Self-pay | Admitting: Women's Health

## 2023-03-21 ENCOUNTER — Ambulatory Visit: Payer: Medicaid Other | Admitting: Women's Health
